# Patient Record
Sex: Female | Born: 2005 | Race: Black or African American | Hispanic: No | Marital: Single | Smoking: Never smoker
Health system: Southern US, Community
[De-identification: ages and names within clinical notes are randomized; demographics above are authoritative.]

## PROBLEM LIST (undated history)

## (undated) DIAGNOSIS — H6120 Impacted cerumen, unspecified ear: Secondary | ICD-10-CM

## (undated) HISTORY — PX: FRENULECTOMY, LINGUAL: SHX1681

---
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
## 2024-01-08 ENCOUNTER — Ambulatory Visit: Admitting: Pediatrics

## 2024-01-10 ENCOUNTER — Ambulatory Visit: Admitting: Pediatrics

## 2024-01-10 ENCOUNTER — Encounter: Payer: Self-pay | Admitting: Pediatrics

## 2024-01-10 ENCOUNTER — Other Ambulatory Visit (HOSPITAL_COMMUNITY)
Admission: RE | Admit: 2024-01-10 | Discharge: 2024-01-10 | Disposition: A | Discharge status: Home / Self Care | Source: Ambulatory Visit | Attending: Pediatrics | Admitting: Pediatrics

## 2024-01-10 VITALS — BP 118/81 | HR 101 | Ht 65.67 in | Wt 213.0 lb

## 2024-01-10 DIAGNOSIS — Z113 Encounter for screening for infections with a predominantly sexual mode of transmission: Secondary | ICD-10-CM | POA: Diagnosis not present

## 2024-01-10 DIAGNOSIS — Z3202 Encounter for pregnancy test, result negative: Secondary | ICD-10-CM

## 2024-01-10 DIAGNOSIS — J309 Allergic rhinitis, unspecified: Secondary | ICD-10-CM

## 2024-01-10 DIAGNOSIS — N926 Irregular menstruation, unspecified: Secondary | ICD-10-CM | POA: Diagnosis not present

## 2024-01-10 LAB — POCT URINE PREGNANCY: Preg Test, Ur: NEGATIVE

## 2024-01-10 MED ORDER — FLUTICASONE PROPIONATE 50 MCG/ACT NA SUSP: 1.0000 | Freq: Every day | NASAL | 5 refills | Status: AC

## 2024-01-10 MED ORDER — FEXOFENADINE HCL 180 MG PO TABS: 180.0000 mg | ORAL_TABLET | Freq: Every day | ORAL | 6 refills | Status: AC

## 2024-01-10 MED ORDER — NORETHIN ACE-ETH ESTRAD-FE 1-20 MG-MCG PO TABS: 1.0000 | ORAL_TABLET | Freq: Every day | ORAL | 11 refills | Status: DC

## 2024-01-10 NOTE — Progress Notes (Unsigned)
 Subjective:    Suzanne Acevedo is a 18 y.o. female  presenting to the clinic today for folow up on breakthrough bleeding on Nexplanon . She was last seen in clinic 2 months back for increased breakthrough bleeding on the Nexplanon  & also some adjustment issues. Nexplanon  placed 2 years ago- 09/14/21. She was started on norethindrone-ethinyl estradiol  for breakthrough bleeding at her last visit & also started sessions with our [facility]. Pt reports that she continues to having prolonged irregular bleeding but has not been taking the OCP. She only took the pills briefly & forgot to take the pill pack. Patient is interested in continuing some form of birth control but is unsure if she wants the Nexplanon .  She would like further discussion regarding birth control options and wants to explore the option of IUD. She recently broke up with her boyfriend but had been sexually active for the last month.   Review of Systems  Constitutional:  Negative for activity change, appetite change, fatigue and fever.  HENT:  Negative for congestion.   Respiratory:  Negative for cough, shortness of breath and wheezing.   Gastrointestinal:  Negative for abdominal pain, diarrhea, nausea and vomiting.  Genitourinary:  Positive for vaginal bleeding. Negative for dysuria.  Skin:  Negative for rash.  Neurological:  Negative for headaches.  Psychiatric/Behavioral:  Negative for sleep disturbance.        Objective:   Physical Exam Vitals and nursing note reviewed.  Constitutional:      General: She is not in acute distress. HENT:     Head: Normocephalic and atraumatic.     Right Ear: External ear normal.     Left Ear: External ear normal.     Nose: Nose normal.  Eyes:     General:        Right eye: No discharge.        Left eye: No discharge.     Conjunctiva/sclera: Conjunctivae normal.  Cardiovascular:     Rate and Rhythm: Normal rate and regular rhythm.     Heart sounds: Normal heart sounds.  Pulmonary:      Effort: No respiratory distress.     Breath sounds: No wheezing or rales.  Musculoskeletal:     Cervical back: Normal range of motion.  Skin:    General: Skin is warm and dry.     Findings: No rash.    .BP 118/81 (BP Location: Left Arm, Patient Position: Sitting, Cuff Size: Normal)   Pulse (!) 101   Ht 5' 5.67 (1.668 m)   Wt 213 lb (96.6 kg)   BMI 34.73 kg/m         Assessment & Plan:  1. Irregular menstrual bleeding (Primary) Advised patient to use OCP if she restarts with the regular bleeding.  Presently her cycle has ended and she does not have any spotting. - norethindrone-ethinyl estradiol -FE (JUNEL FE 1/20) 1-20 MG-MCG tablet; Take 1 tablet by mouth daily.  Dispense: 28 tablet; Refill: 11  Also discussed various options for OCP including IUD.  She would like to schedule an appointment to discuss IUD and removal of Nexplanon .  Appointment scheduled with red pod 2. Screening examination for venereal disease  - Urine cytology ancillary only   3. Allergic rhinitis, unspecified seasonality, unspecified trigger Refilled meds - fluticasone  (FLONASE ) 50 MCG/ACT nasal spray; Place 1 spray into both nostrils daily.  Dispense: 16 g; Refill: 5   4.  Adjustment issues Patient expressed sadness regarding break-up with her boyfriend as well as lack  of clarity regarding her career in future.  Encouraged her to continue appointments with [facility] and to also look into [employer] options and reapplying for school spring 2026  Time spent reviewing chart in preparation for visit:  5 minutes Time spent face-to-face with patient: 20 minutes Time spent not face-to-face with patient for documentation and care coordination on date of service: 5 minutes  Return in about 4 weeks (around 02/07/2024) for red pod- Bari Molt.  Arthor Harris, MD 01/12/2024 6:22 PM

## 2024-01-11 ENCOUNTER — Ambulatory Visit

## 2024-01-11 DIAGNOSIS — F411 Generalized anxiety disorder: Secondary | ICD-10-CM | POA: Diagnosis not present

## 2024-01-11 LAB — URINE CYTOLOGY ANCILLARY ONLY
Chlamydia: NEGATIVE
Comment: NEGATIVE
Comment: NORMAL
Neisseria Gonorrhea: NEGATIVE

## 2024-01-11 NOTE — BH Specialist Note (Unsigned)
 Integrated Behavioral Health Follow Up In-Person Visit  MRN: [id number] Name: Suzanne Acevedo  Number of Integrated Behavioral Health Clinician visits: 5-Fifth Visit  Session Start time: 1330   Session End time: 1434  Total time in minutes: 64    Types of Service: {CHL AMB TYPE OF SERVICE:[phone]}  Interpretor:{yes wn:[id number]} Interpretor Name and Language: ***  Subjective: Suzanne Acevedo is a 18 y.o. female accompanied by {Patient accompanied by:[phone]} Patient was referred by *** for ***. Patient reports the following symptoms/concerns: *** Duration of problem: ***; Severity of problem: {Mild/Moderate/Severe:20260}  Objective: Mood: {BHH MOOD:22306} and Affect: {BHH AFFECT:22307} Risk of harm to self or others: {CHL AMB BH Suicide Current Mental Status:21022748}  Life Context: Family and Social: *** School/Work: *** Self-Care: *** Life Changes: ***  Patient and/or Family's Strengths/Protective Factors: {CHL AMB BH PROTECTIVE FACTORS:[phone]}  Goals Addressed: Patient will:  Reduce symptoms of: {IBH Symptoms:21014056}   Increase knowledge and/or ability of: {IBH Patient Tools:21014057}   Demonstrate ability to: {IBH Goals:21014053}  Progress towards Goals: {CHL AMB BH PROGRESS TOWARDS GOALS:[phone]}  Interventions: Interventions utilized:  {IBH Interventions:21014054} Standardized Assessments completed: {IBH Screening Tools:21014051}      Patient and/or Family Response: ***  Patient Centered Plan: Patient is on the following Treatment Plan(s): ***  Clinical Assessment/Diagnosis  No diagnosis found.    Assessment: Patient currently experiencing ***.   Patient may benefit from ***.  Plan: Follow up with behavioral health clinician on : *** Behavioral recommendations: *** Referral(s): {IBH Referrals:21014055}  [employer], LCSW

## 2024-01-11 NOTE — BH Specialist Note (Deleted)
 ADULT Comprehensive Clinical Assessment (CCA) Note   01/11/2024 Suzanne Acevedo [id number]   Referring Provider: Dr. Gabriella Session Start time: 1559    Session End time: 1650  Total time in minutes: 51    SUBJECTIVE: Suzanne Acevedo is a 18 y.o.   female accompanied by Self  [name] was seen in consultation at the request of [name], MD for evaluation of {CHL AMB PED BEHAVIORAL LEARNING PROBLEMS:210130101}.  Types of Service: Comprehensive Clinical Assessment (CCA)  Reason for referral in patient/family's own words:  ***    She likes to be called Suzanne Acevedo.  She came to the appointment with {CHL AMB ACCOMPANIED AB:[id number]}.  Primary language at home is {CHL AMB BASIC LANGUAGE SPOKEN:[phone]}  Constitutional Appearance: {CHL AMB PED CONSTITUTIONAL:210130113}, well-nourished, well-developed, alert and well-appearing  (Patient to answer as appropriate) Gender identity: *** Sex assigned at birth: *** Pronouns: {he/she/they:23295}   Mental status exam:   General Appearance /Behavior:  {BHH GENERALAPPEARANCE/BEHAVIOR:22300} Eye Contact:  {BHH EYE CONTACT:22301} Motor Behavior:  {BHH MOTOR BEHAVIOR:22302} Speech:  {BHH SPEECH:22304} Level of Consciousness:  {BHH LEVEL OF CONSCIOUSNESS:22305} Mood:  {BHH MOOD:22306} Affect:  {BHH AFFECT:22307} Anxiety Level:  {BHH ANXIETY LEVEL:22308} Thought Process:  {BHH THOUGHT PROCESS:22309} Thought Content:  {BHH THOUGHT CONTENT:22310} Perception:  {BHH PERCEPTION:22311} Judgment:  {BHH JUDGMENT:22312} Insight:  {BHH INSIGHT:22313}   Current Medications and therapies: She is taking:  {CHL AMB TAKING MEDICATIONS:220130102}   Therapies:  {CHL AMB [phone]}  Family history: Family mental illness:  {CHL AMB FAMILY MENTAL ILLNESS:[phone]} Family school achievement history:  {CHL AMB FAMILY SCHOOL ACHIEVEMENT HISTORY:[phone]} Other relevant family history:  {CHL AMB OTHER RELEVANT FAMILY  HISTORY:210130114}  Social History: Now living with {CHL AMB LIVING TPUY:7898698971}. {CHL AMB PED PARENT/GUARDIAN RELATIONS:210130115}. Employment:  {CHL AMB PARENT/GUARDIAN [phone]} Main caregiver's health:  {CHL AMB CAREGIVER HEALTH:[phone]} Religious or Spiritual Beliefs: ***  Mood: She {CHL AMB PARENTS MOOD CONCERNS:[phone]}. {CHL AMB [phone]}  Negative Mood Concerns {CHL AMB NEGATIVE THOUGHTS:210130169}. Self-injury:  {CHL AMB DID NOT JDX:789869825} Suicidal ideation:  {CHL AMB DID NOT JDX:789869825} Suicide attempt:  {CHL AMB DID NOT JDX:789869825}  Additional Anxiety Concerns: Panic attacks:  {CHL AMB YES/NO/NOT APPLICABLE:210130111} Obsessions:  {CHL AMB YES/NO/NOT APPLICABLE:210130111} Compulsions:  {CHL AMB YES/NO/NOT APPLICABLE:210130111}  Stressors:  {CHL AMB BH STRESSORS:6185412307}  Alcohol and/or Substance Use: Have you recently consumed alcohol? {YES/NO/WILD RJMID:81418}  Have you recently used any drugs?  {YES/NO/WILD RJMID:81418}  Have you recently consumed any tobacco? {YES/NO/WILD CARDS:18581} Does patient seem concerned about dependence or abuse of any substance? {YES/NO/WILD RJMID:81418}  Substance Use Disorder Checklist:  {CHL AMB BH CHECKLIST FOR SUBSTANCE USE DISORDER:[phone]}  Severity Risk Scoring based on DSM-5 Criteria for Substance Use Disorder. The presence of at least two (2) criteria in the last 12 months indicate a substance use disorder. The severity of the substance use disorder is defined as:  Mild: Presence of 2-3 criteria Moderate: Presence of 4-5 criteria Severe: Presence of 6 or more criteria  Traumatic Experiences: History or current traumatic events (natural disaster, house fire, etc.)? {YES/NO/WILD RJMID:81418} History or current physical trauma?  {YES/NO/WILD RJMID:81418} History or current emotional trauma?  {YES/NO/WILD RJMID:81418} History or current sexual trauma?  {YES/NO/WILD  RJMID:81418} History or current domestic or intimate partner violence?  {YES/NO/WILD RJMID:81418} History of bullying:  {YES/NO/WILD CARDS:18581}  Risk Assessment: Suicidal or homicidal thoughts?   {YES/NO/WILD RJMID:79211} Self injurious behaviors?  {YES/NO/WILD RJMID:79211} Guns in the home?  {YES/NO/WILD RJMID:79211}  Self Harm Risk Factors: {CHL AMB BH SELF HARM RISK FACTORS:[phone]}  Self  Harm Thoughts?: {CHL AMB BH SELF HARM THOUGHTS:[phone]}  Patient and/or Family's Strengths/Protective Factors: {CHL AMB BH PROTECTIVE FACTORS:[phone]}  Patient's and/or Family's Goals in their own words: ***  Interventions: Interventions utilized:  {IBH Interventions:21014054:::0}   Patient and/or Family Response: ***  Standardized Assessments completed: {IBH Screening Tools:21014051:::0}  Patient Centered Plan: Patient is on the following Treatment Plan(s):  ***  Clinical Assessment/Diagnosis  No diagnosis found.      Assessment: Patient currently experiencing ***.   Patient may benefit from ***.  Coordination of Care: {CHL AMB BH COORDINATION OF CARE:5313798032}  DSM-5 Diagnosis: ***  Recommendations for Services/Supports/Treatments: ***  Progress towards Goals: {CHL AMB BH PROGRESS TOWARDS HNJOD:7896499943}  Treatment Plan Summary: Behavioral Health Clinician will: {CHL AMB BH TREATMENT PLAN SUMMARY THERAPIST TPOO:7896499945}  Individual will: {CHL AMB BH TREATMENT PLAN SUMMARY INDIVDUAL TPOO:7896499946}  Referral(s): {IBH Referrals:21014055}  [employer], LCSW

## 2024-01-17 ENCOUNTER — Ambulatory Visit

## 2024-01-17 DIAGNOSIS — F411 Generalized anxiety disorder: Secondary | ICD-10-CM | POA: Diagnosis not present
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
## 2024-01-24 ENCOUNTER — Ambulatory Visit (INDEPENDENT_AMBULATORY_CARE_PROVIDER_SITE_OTHER)

## 2024-01-24 DIAGNOSIS — F411 Generalized anxiety disorder: Secondary | ICD-10-CM | POA: Diagnosis not present

## 2024-01-24 NOTE — Patient Instructions (Signed)
  Practice one of this skills each morning for the next week:  1. Deep Breathing Breathwork is a classic mindfulness activity, which typically refers to purposefully manipulating the breath while mindfully focusing on it. This particular activity refers to deep, slow breaths (sometimes called belly breaths) that use the diaphragm. This encourages your body to relax.  2. Paced Breathing (e.g., Inhale 5, Exhale 7) An additional type of breathwork is paced breathing, where the length of the inhales and exhales are purposefully manipulated. It can be helpful to have a longer exhale than inhale, because our heart rate slightly slows during the exhale. Try inhaling to a count of 5, and exhaling to a count of 7. Use the skills from the previous activities (deep breathing) so your breaths are diaphragmatic.  3. Progressive Muscle Relaxation Progressive muscle relaxation refers to tensing and releasing specific muscle groups. For example, scrunch your shoulders up to your ears; bring as much tension as possible to your neck and shoulders. Slowly count to three and release all that tension. Now continue that with all muscle groups, including your hands, arms, chest, and stomach. You can do larger or smaller muscle groups depending on your preferences.  4. Meditation There are many types of meditation, which typically involve keeping one physical position and paying attention to one thing, like your breathing, a mantra, or physical sensations. When your mind wanders--and it will--nonjudgmentally bring it back to that point of attention. Meditating can be uncomfortable at first, and may not be appropriate for ages. If it feels too uncomfortable, it's OK to use other mindfulness activities.  5. Grounding With 5-4-3-2-1 The 5-4-3-2-1 exercise brings teens, or people of any age, back to the present moment through all of their senses.  Notice and say out loud or internally:  5 things you can see (pick a color,  for example, 5 blue things) 4 sensations you can feel (e.g., your back against the chair, cool air on your hands) 3 sounds you can hear 2 things you can smell (it's OK to actively smell things, like the laundry detergent on your clothes) 1 thing you can taste

## 2024-01-24 NOTE — BH Specialist Note (Signed)
 Integrated Behavioral Health Follow Up In-Person Visit  MRN: [id number] Name: Suzanne Acevedo  Number of Integrated Behavioral Health Clinician visits: Additional Visit  Session Start time: 9062   Session End time: 1010  Total time in minutes: 33    Types of Service: Individual psychotherapy  Interpretor:No. Interpretor Name and Language: n/a   Subjective: Suzanne Acevedo is a 18 y.o. female accompanied by self Suzanne Acevedo was referred by Dr. Gabriella for mood. Suzanne Acevedo reports the following symptoms/concerns: Suzanne Acevedo shared that she has been feeling better today. She reported that her anxiety was elevated yesterday and she felt that her heart was dropping throughout the day. She expressed that it feels like an anxiety attack because she also has shortness of breath.  Initially she couldn't identify the trigger, but after support from [facility] she realized she was thinking about he ex boyfriend.   Suzanne Acevedo has been journaling daily and completed assignments from previous visit. She shared traits and behaviors that her previous relationship brought out in her including anxiety, impulsiveness and anger. These are areas she would like to improve.   Suzanne Acevedo expressed interest in medication management consultation with Dr. Gabriella. Duration of problem: years; Severity of problem: moderate  Objective: Mood: Euthymic and Affect: Appropriate Risk of harm to self or others: No plan to harm self or others    Patient and/or Family's Strengths/Protective Factors: Concrete supports in place (healthy food, safe environments, etc.) and Physical Health (exercise, healthy diet, medication compliance, etc.)  Goals Addressed: Patient will:  Suzanne Acevedo will identify and implement at least 3 coping strategies (e.g. grounding techniques, deep breathing, journaling) to manage symptoms of anxiety, reporting a reductions in anxiety from daily levels of 7/10 to 5/10 or less on self rating scale within 12 weeks.  Suzanne Acevedo will  practice and demonstrate at least 2 emotional regulation strategies (e.g., mindfulness, stop and think pause, self-soothing) during session and in real life situations, resulting in a decrease in impulsive reactions and improved communication in at least 2 identified interpersonal relationships within 16 weeks.  Progress towards Goals: Revised and Ongoing  Interventions: Interventions utilized:  CBT Cognitive Behavioral Therapy [facility] utilized CBT to explore thoughts surrounding the incident with her heart dropping. Discussed challenging negative thinking to improve overall mood. Provided space for her to share her response to the prompt and engaged her in reflection for ways to improve habits and behaviors. Educated her on grounding strategies to use when she feels anxious including mindful breathing.  Standardized Assessments completed: Not Needed    Patient and/or Family Response: Suzanne Acevedo was engaged and attentive during the visit. She was open to discussing current stressors and recent incident with heart dropping. Suzanne Acevedo was able to demonstrate thought challenging negative thoughts during the visit. She displayed reflective thinking when discussing her desire to contact her ex noting that it may be a barrier to her progress.   Patient Centered Plan: Patient is on the following Treatment Plan(s): Interpersonal Stress  Clinical Assessment/Diagnosis  Generalized anxiety disorder    Assessment: Suzanne Acevedo currently experiencing increased anxiety and stress following recent break-up with boyfriend. Suzanne Acevedo is demonstrating ability to thoughtfully reflect during the visit. Has demonstrated impulse control by not reaching out to her ex boyfriend despite wanting to.    Suzanne Acevedo may benefit from continuing therapy  to learn ways to activate positive behaviors and coping strategies to manage anxiety and impulsiveness. .  Plan: Follow up with behavioral health clinician on : 02/01/2024 Behavioral  recommendations:  Review information shared about mindful strategies.  Referral(s):  Integrated [facility]al [facility] Services (In Clinic)   Individual Treatment Plan  Strengths:  Suzanne Acevedo is motivated to improve functioning and overall well-being.  2. Insightful and self aware about challenges   Supports: Suzanne Acevedo has support from her family.    Goal/Needs for Treatment:  In order of importance to patient  1) Suzanne Acevedo will identify and implement at least 3 coping strategies (e.g. grounding techniques, deep breathing, journaling) to manage symptoms of anxiety, reporting a reductions in anxiety from daily levels of 7/10 to 5/10 or less on self rating scale within 12 weeks.   2) Suzanne Acevedo will practice and demonstrate at least 2 emotional regulation strategies (e.g., mindfulness, stop and think pause, self-soothing) during session and in real life situations, resulting in a decrease in impulsive reactions and improved communication in at least 2 identified interpersonal relationships within 16 weeks.    Client Statement of Needs: I need to work on managing my emotions and decrease my anxiety.   Treatment Level Weekly   Symptoms: Suzanne Acevedo has been experiencing increased levels of anxiety and difficulty with interpersonal relationships for the past several years.  Struggles with emotional regulation and impulsivity.   Client Treatment Preferences: Prefers weekly in person visits. Visits for 1 hour. Open ended discussions/talk therapy with practical applications of skills to current stressors.    Healthcare consumer's goal for treatment: Reduce symptoms of anxiety and emotional dysregulation   Healthcare consumer will: Actively participate in therapy, working towards healthy functioning.   Behavioral Health Clinician will: Provide individual therapy utilizing CBT strategies to help Suzanne Acevedo identify and challenge anxious thought patterns, build coping skills, and reduce overall anxiety symptoms.     *Justification for Continuation/Discontinuation of Goal: R=Revised, O=Ongoing, A=Achieved, D=Discontinued  Goal 1)  Likert rating baseline date 01/24/2024: How Easy -      ; How Often -      % Target Date Goal Was reviewed Status Code Progress towards goal/Likert rating  04/20/2024                Goal 2)  Likert rating baseline date 9/24/20254: How Easy -     ; How Often -      % Target Date Goal Was reviewed Status Code Progress towards goal/Likert rating  05/21/2024                   This plan will be reviewed at least every 12 months. This plan has been reviewed and created by the following participants:   Date Behavioral Health Clinician Date Guardian/Patient                          Silvano JINNY [place], [place]

## 2024-02-01 ENCOUNTER — Ambulatory Visit

## 2024-02-01 DIAGNOSIS — F411 Generalized anxiety disorder: Secondary | ICD-10-CM | POA: Diagnosis not present

## 2024-02-01 NOTE — BH Specialist Note (Unsigned)
 Integrated Behavioral Health via Telemedicine Visit  02/02/2024 Suzanne Acevedo [id number]  Number of Integrated Behavioral Health Clinician visits: Additional Visit  Session Start time: 1330   Session End time: 1431  Total time in minutes: 61    Referring Provider: Dr. Gabriella Patient/Family location: Home  [facility] Provider location: [facility] Office  All persons participating in visit: [facility] and patient Types of Service: Individual psychotherapy and Video visit  I connected with Mahati Zweber  via  Telephone or Video Enabled Telemedicine Application  (Video is Caregility application) and verified that I am speaking with the correct person using two identifiers. Discussed confidentiality: Yes   I discussed the limitations of telemedicine and the availability of in person appointments.  Discussed there is a possibility of technology failure and discussed alternative modes of communication if that failure occurs.  I discussed that engaging in this telemedicine visit, they consent to the provision of behavioral healthcare and the services will be billed under their insurance.  Patient and/or legal guardian expressed understanding and consented to Telemedicine visit: Yes   Presenting Concerns: Briannon and/or family reports the following symptoms/concerns:   -continued anxiety regarding recent break up -negative thought patterns about self.  - still experiencing heart drop when she feels anxious.   Duration of problem: months to years; Severity of problem: mild  Patient and/or Family's Strengths/Protective Factors: Concrete supports in place (healthy food, safe environments, etc.) and Physical Health (exercise, healthy diet, medication compliance, etc.)  Goals Addressed: Leronda will:  Kaydon will identify and implement at least 3 coping strategies (e.g. grounding techniques, deep breathing, journaling) to manage symptoms of anxiety, reporting a reductions in anxiety from daily levels of  7/10 to 5/10 or less on self rating scale within 12 weeks.  Mikenzie will practice and demonstrate at least 2 emotional regulation strategies (e.g., mindfulness, stop and think pause, self-soothing) during session and in real life situations, resulting in a decrease in impulsive reactions and improved communication in at least 2 identified interpersonal relationships within 16 weeks. Progress towards Goals: Ongoing    Interventions: Interventions utilized:  Motivational Interviewing, CBT Cognitive Behavioral Therapy, and Supportive Counseling [facility] engaged Lenyx in reflection of how current behaviors, thoughts, and actions impact (positively or negatively) her personal goals. Reviewed the cognitive model and encouraged Kieley to challenge negative thoughts as they arise. [facility] challenged Trinita's thoughts about closure and supported her in coming to terms with her inability to control the future.   Standardized Assessments completed: Not Needed    Patient and/or Family Response: Karesha was engaged and attentive during the visit. She updated [facility] on her recent cabin trip with family. She reported that she enjoyed herself, but that there were a lot of feelings. Anshi started to think about her ex boyfriend, he should be here, which she reported made her feel down. One of her cousins brought her new boyfriend and Delayza shared that it made her feel hopeful for her future. She shared her cousin was also in an unhealthy relationship and is with someone better now.   Kathryn continued expressing frustration about still missing and thinking about her ex. She stated I want to heal the right way. Tyjae disclosed that she still notices negative thought patterns when she thinks about him meeting someone else and treating them better than her. These thoughts increase her feelings of anxiety. She stated I just want to talk to him to have closure. I need to know if we are really done forever. [facility] challenged  Laurelyn's thoughts about  closure and supported her in coming to terms with her inability to control the future.   George engaged with [facility] to challenge negative thoughts in real time. Ximenna identified personal goals she has for herself and how her what she does not impacts those goals. Madysin reviewed her previous journal assignment with [facility] answering what her life would be like if she and her ex did not get back together. She identified several positives for how her life would be. Processing with [facility] seemed to help shift Jennette's mindset during the visit.   Clinical Assessment/Diagnosis  Generalized anxiety disorder    Assessment: Semya currently experiencing on-going anxiety and stress due to break-up. It appears that interpersonal dependence is greatly impacting her mood and behaviors. Jailee's need for closure from her ex demonstrates her dependence on the relationship to determine how she moves forwards, if he tells me its really done then I can move one. While Jonnae expressed understanding of personal areas of improvement, it appears that her desire for companionship may over power desire to address her own underlying issues.    Arshia may benefit from from continuing therapy to learn ways to activate positive behaviors and coping strategies to manage anxiety and impulsiveness. .  Plan: Follow up with behavioral health clinician on : 02/14/2024 Behavioral recommendations:  Continue journaling to help with self-reflection  Referral(s): Integrated [employer] (In Clinic)  I discussed the assessment and treatment plan with the patient and/or parent/guardian. They were provided an opportunity to ask questions and all were answered. They agreed with the plan and demonstrated an understanding of the instructions.   They were advised to call back or seek an in-person evaluation if the symptoms worsen or if the condition fails to improve as anticipated.  Silvano PARAS [place],  LCSW

## 2024-02-12 ENCOUNTER — Other Ambulatory Visit (HOSPITAL_COMMUNITY)
Admission: RE | Admit: 2024-02-12 | Discharge: 2024-02-12 | Disposition: A | Discharge status: Home / Self Care | Source: Ambulatory Visit | Attending: Family | Admitting: Family

## 2024-02-12 ENCOUNTER — Ambulatory Visit: Admitting: Family

## 2024-02-12 ENCOUNTER — Encounter: Payer: Self-pay | Admitting: Family

## 2024-02-12 VITALS — BP 126/76 | HR 69 | Ht 66.0 in | Wt 205.8 lb

## 2024-02-12 DIAGNOSIS — F411 Generalized anxiety disorder: Secondary | ICD-10-CM

## 2024-02-12 DIAGNOSIS — Z113 Encounter for screening for infections with a predominantly sexual mode of transmission: Secondary | ICD-10-CM | POA: Insufficient documentation

## 2024-02-12 DIAGNOSIS — Z68.41 Body mass index (BMI) pediatric, 85th percentile to less than 95th percentile for age: Secondary | ICD-10-CM

## 2024-02-12 DIAGNOSIS — E559 Vitamin D deficiency, unspecified: Secondary | ICD-10-CM | POA: Diagnosis not present

## 2024-02-12 DIAGNOSIS — N926 Irregular menstruation, unspecified: Secondary | ICD-10-CM | POA: Diagnosis not present

## 2024-02-12 DIAGNOSIS — Z8349 Family history of other endocrine, nutritional and metabolic diseases: Secondary | ICD-10-CM

## 2024-02-12 DIAGNOSIS — Z3202 Encounter for pregnancy test, result negative: Secondary | ICD-10-CM | POA: Diagnosis not present

## 2024-02-12 DIAGNOSIS — Z3046 Encounter for surveillance of implantable subdermal contraceptive: Secondary | ICD-10-CM | POA: Diagnosis not present

## 2024-02-12 DIAGNOSIS — N925 Other specified irregular menstruation: Secondary | ICD-10-CM

## 2024-02-12 DIAGNOSIS — Z975 Presence of (intrauterine) contraceptive device: Secondary | ICD-10-CM | POA: Diagnosis not present

## 2024-02-12 DIAGNOSIS — N921 Excessive and frequent menstruation with irregular cycle: Secondary | ICD-10-CM | POA: Diagnosis not present

## 2024-02-12 LAB — POCT URINE PREGNANCY: Preg Test, Ur: NEGATIVE

## 2024-02-12 MED ORDER — NORETHIN ACE-ETH ESTRAD-FE 1-20 MG-MCG PO TABS: 1.0000 | ORAL_TABLET | Freq: Every day | ORAL | 0 refills | Status: DC

## 2024-02-12 MED ORDER — HYDROXYZINE HCL 25 MG PO TABS: 25.0000 mg | ORAL_TABLET | Freq: Every evening | ORAL | 0 refills | Status: DC | PRN

## 2024-02-12 NOTE — Progress Notes (Signed)
 Acute Visit:   Suzanne Arthor GAILS, MD  Plan from last visit: Use OCPs to regulate cycle, discuss IUD, allergic Rhinitis   Pertinent Labs: -Upreg, cytology  Chart/Growth Chart Review: 115% of 95th percentile  HPI:   History was provided by the patient.  Suzanne Acevedo is a 18 y.o. female who is here for follow up regarding her mood and her irregular menstrual bleeding on her nexplanon . She reports that since she started therapy, she feels she is doing a bit better but is still having a fair amount of anxiety and difficulty regulating her mood. Her mood and anxiety symptoms have worsened since a breakup with her ex-boyfriend around a month ago. In light of this, she is wondering if her birth control could be the culprit for her mood symptoms because ever since she first began birth control as a younger teen, she felt she has had issues with her mood to some degree. They can be worse around her menses but not exclusively. She feels that she is having bouts of anger and irritability where she may say something to someone that she could regret. She is wondering if stopping/changing her birth control regimen would remedy these issues.  She also endorses continued irregular periods in which she will bleed for a month or so at a time. The first two weeks are heavier and then become lighter for the next few weeks but never fully cease. The months of bleeding will then be followed by a month of no bleeding whatsoever. She denies feeling lightheaded or dizzy and has never been anemic. Mom does have a history of heavy menstrual bleeding necessitating hysterectomy. She is wondering if stopping or changing her contraceptives will also help regulate her cycle as she says her periods were regular, and lasted 7 days prior to starting her birth control. She was prescribed a course of OCPs but did not end up taking the full course.  She does also endorse a history of vitamin D  deficiency but has not been taking her  supplements regularly.    Birth Control Side Effect Management:  Compliance: Has Nexplanon  Bleeding Pattern: Irregular, on for month to months LMP: Uncertain Cramping: not reported  Mood concerns/changes: yes Any other side effects: none reported Sexually active: Not currently  Review of Systems  Constitutional: Negative.   Cardiovascular:  Negative for leg swelling.  Gastrointestinal: Negative.   Genitourinary: Negative.   Musculoskeletal: Negative.   Neurological:  Negative for dizziness and weakness.  Psychiatric/Behavioral:  The patient is nervous/anxious and has insomnia.      Patient Active Problem List   Diagnosis Date Noted   Shoulder pain 03/04/2021   Oral allergy  syndrome, initial encounter 07/11/2017   Overweight, pediatric, BMI 85.0-94.9 percentile for age 40/14/2017   BMI (body mass index), pediatric, 85% to less than 95% for age 42/13/2016   Other acne 08/14/2014   Allergic rhinitis 08/12/2014    Current Outpatient Medications on File Prior to Visit  Medication Sig Dispense Refill   Etonogestrel  (NEXPLANON  [place]) Inject into the skin.     fexofenadine  (ALLEGRA  ALLERGY ) 180 MG tablet Take 1 tablet (180 mg total) by mouth daily. 30 tablet 6   fluticasone  (FLONASE ) 50 MCG/ACT nasal spray Place 1 spray into both nostrils daily. 16 g 5   ibuprofen  (ADVIL ) 800 MG tablet Take 1 tablet (800 mg total) by mouth 3 (three) times daily. (Patient not taking: Reported on 11/06/2023) 21 tablet 0   norethindrone-ethinyl estradiol -FE (JUNEL FE 1/20) 1-20 MG-MCG tablet Take 1  tablet by mouth daily. 28 tablet 11   No current facility-administered medications on file prior to visit.    Allergies  Allergen Reactions   Milk-Related Compounds Rash    Physical Exam:    Vitals:   02/12/24 0952 02/12/24 0955  BP: 133/85 126/76  Pulse: 83 69  Weight:  205 lb 12.8 oz (93.4 kg)  Height:  5' 6 (1.676 m)    Blood pressure %iles are not available for patients who are 18 years or  older. No LMP recorded. Patient has had an implant.  Physical Exam Constitutional:      Appearance: Normal appearance. She is obese. She is not ill-appearing.  HENT:     Head: Normocephalic and atraumatic.  Cardiovascular:     Rate and Rhythm: Normal rate and regular rhythm.     Pulses: Normal pulses.     Heart sounds: Normal heart sounds.  Pulmonary:     Effort: Pulmonary effort is normal.  Abdominal:     General: There is no distension.  Musculoskeletal:        General: Normal range of motion.     Cervical back: Normal range of motion.     Right lower leg: No edema.     Left lower leg: No edema.  Skin:    General: Skin is warm and dry.  Neurological:     General: No focal deficit present.     Mental Status: She is alert. Mental status is at baseline.  Psychiatric:        Mood and Affect: Mood normal.        Behavior: Behavior normal.        Thought Content: Thought content normal.        Judgment: Judgment normal.     Assessment/Plan: 1. Routine screening for STI (sexually transmitted infection) (Primary) - Urine cytology ancillary only  2. Pregnancy examination or test, negative result - POCT urine pregnancy  3. Generalized anxiety disorder We had a long discussion today about her overal mental health in light of her menstrual changes and birth control concerns. It sounds like her symptoms are more consistent with generalized anxiety irrespective of her menstrual symptoms. We discussed SSRI vs trial of Atarax  at bedtime as she is also having some difficulty sleeping because of anxious thoughts keeping her up. She even endorses anxiety when she is doing good things for herself like going for a walk or reading her bible. Ultimately, we decided to start the Atarax  25-50mg  at bedtime to see if this improves her sleep and in turn her overall mood. We will evaluate her in 2 weeks to see if this has been effective for her at which time we can also consider SSRI initiation. We  also counseled her on the importance of exercise, positive thoughts, and getting some sunlight each day to improve mood and for her to also help with her vitamin D  deficiency.  4. Breakthrough bleeding on Nexplanon  We discussed IUD vs removal of birth control implant and ultimately decided we will try to use the OCPs to regulate her cycle. Given her reported bleeding length during menses, we also want to make sure she is not anemic especially given family history in mom of AUB with hysterectomy. She did not take the full pill pack (only 4 days) last time so we discussed importance of taking all pills save the placebo week. - CBC with Differential/Platelet - Hemoglobin A1c - norethindrone-ethinyl estradiol -FE (JUNEL FE 1/20) 1-20 MG-MCG tablet; Take 1 tablet by mouth  daily.  Dispense: 28 tablet; Refill: 0  5. Family history of thyroid disease Given mood symptoms with weight gain noted on growth chart, it is reasonable to perform TFTs to rule out an underlying medical etiology for her mood symptoms. - CBC with Differential/Platelet - Comprehensive metabolic panel with GFR - Hemoglobin A1c - Thyroid Panel With TSH  6. BMI (body mass index), pediatric, 85% to less than 95% for age Discussed importance of good exercise and how it can help with mood regulation as well as how it is beneficial for overall health and wellbeing. We will also check an A1c and lipids because mom had mentioned a first-degree relative who has PCOS which could also be contributing to her menstrual irregularities. Conversely, it could also be her additional weight gain alone that has exacerbated this bleeding irregularity. - CBC with Differential/Platelet - Hemoglobin A1c - Lipid panel  7. Vitamin D  deficiency We will test for her current vitamin D  levels, if deficient, will consider supplementation. She had been prescribed these in the past but has not taken them for some time. - VITAMIN D  25 Hydroxy (Vit-D Deficiency,  Fractures)

## 2024-02-12 NOTE — Patient Instructions (Signed)
 Today we discussed a few things:   1) we will review the results from the blood work at your upcoming appointment 2) take hydroxyzine  25-50 mg as needed at bedtime to help with sleep.  3) we will start the ADHD pathway   4) take birth control pills (only the first 3 rows) when you are bleeding.

## 2024-02-13 LAB — COMPREHENSIVE METABOLIC PANEL WITH GFR
AG Ratio: 1.6 (calc) (ref 1.0–2.5)
ALT: 9 U/L (ref 5–32)
AST: 17 U/L (ref 12–32)
Albumin: 4.5 g/dL (ref 3.6–5.1)
Alkaline phosphatase (APISO): 47 U/L (ref 36–128)
BUN: 9 mg/dL (ref 7–20)
CO2: 27 mmol/L (ref 20–32)
Calcium: 9.6 mg/dL (ref 8.9–10.4)
Chloride: 105 mmol/L (ref 98–110)
Creat: 0.83 mg/dL (ref 0.50–0.96)
Globulin: 2.8 g/dL (ref 2.0–3.8)
Glucose, Bld: 104 mg/dL (ref 65–139)
Potassium: 3.9 mmol/L (ref 3.8–5.1)
Sodium: 139 mmol/L (ref 135–146)
Total Bilirubin: 0.6 mg/dL (ref 0.2–1.1)
Total Protein: 7.3 g/dL (ref 6.3–8.2)
eGFR: 104 mL/min/1.73m2 (ref >=60)

## 2024-02-13 LAB — CBC WITH DIFFERENTIAL/PLATELET
Absolute Lymphocytes: 1767 {cells}/uL (ref 850–3900)
Absolute Monocytes: 263 {cells}/uL (ref 200–950)
Basophils Absolute: 19 {cells}/uL (ref 0–200)
Basophils Relative: 0.4 %
Eosinophils Absolute: 19 {cells}/uL (ref 15–500)
Eosinophils Relative: 0.4 %
HCT: 33.5 % — ABNORMAL LOW (ref 35.0–45.0)
Hemoglobin: 10.4 g/dL — ABNORMAL LOW (ref 11.7–15.5)
MCH: 25.6 pg — ABNORMAL LOW (ref 27.0–33.0)
MCHC: 31 g/dL — ABNORMAL LOW (ref 32.0–36.0)
MCV: 82.3 fL (ref 80.0–100.0)
Monocytes Relative: 5.6 %
Neutro Abs: 2632 {cells}/uL (ref 1500–7800)
Neutrophils Relative %: 56 %
Platelets: 380 Thousand/uL (ref 140–400)
RBC: 4.07 Million/uL (ref 3.80–5.10)
RDW: 15.8 % — ABNORMAL HIGH (ref 11.0–15.0)
Total Lymphocyte: 37.6 %
WBC: 4.7 Thousand/uL (ref 3.8–10.8)

## 2024-02-13 LAB — THYROID PANEL WITH TSH
Free Thyroxine Index: 2.5 (ref 1.4–3.8)
T4, Total: 8.2 ug/dL (ref 5.3–11.7)
TSH: 1.92 m[IU]/L
TSH: 31 m[IU]/L (ref 22–35)

## 2024-02-13 LAB — LIPID PANEL
Cholesterol: 133 mg/dL (ref <170)
HDL: 66 mg/dL (ref >45)
LDL Cholesterol (Calc): 52 mg/dL (ref <110)
Non-HDL Cholesterol (Calc): 67 mg/dL (ref <120)
Total CHOL/HDL Ratio: 2 (calc) (ref <5.0)
Triglycerides: 70 mg/dL (ref <90)

## 2024-02-13 LAB — URINE CYTOLOGY ANCILLARY ONLY
Chlamydia: NEGATIVE
Comment: NEGATIVE
Comment: NEGATIVE
Comment: NORMAL
Neisseria Gonorrhea: NEGATIVE
Trichomonas: NEGATIVE

## 2024-02-13 LAB — VITAMIN D 25 HYDROXY (VIT D DEFICIENCY, FRACTURES): Vit D, 25-Hydroxy: 20 ng/mL — ABNORMAL LOW (ref 30–100)

## 2024-02-13 LAB — HEMOGLOBIN A1C
Hgb A1c MFr Bld: 5.6 % (ref <5.7)
Mean Plasma Glucose: 114 mg/dL
eAG (mmol/L): 6.3 mmol/L

## 2024-02-14 ENCOUNTER — Ambulatory Visit

## 2024-02-14 ENCOUNTER — Encounter: Payer: Self-pay | Admitting: Family

## 2024-02-14 DIAGNOSIS — F411 Generalized anxiety disorder: Secondary | ICD-10-CM | POA: Diagnosis not present

## 2024-02-14 NOTE — BH Specialist Note (Unsigned)
 Integrated Behavioral Health Follow Up In-Person Visit  MRN: [id number] Name: Leyanna Mcree  Number of Integrated Behavioral Health Clinician visits: Additional Visit  Session Start time: 1334   Session End time: 1431  Total time in minutes: 57    Types of Service: Individual psychotherapy  Interpretor:No. Interpretor Name and Language: n/a  Subjective: Shatima Short is a 18 y.o. female accompanied by self Lina was referred by Dr. Gabriella for concerns with mood. Zayanna reports the following symptoms/concerns:  Diamonds shared that over the past week she has been feeling the best she has in a while. She reported that she been intentionally and consistently implementing coping strategies learned in session and she feels it has helped. Aerica informed [facility] that her ex boyfriend reached out to her earlier this week and it was confusing. She acknowledged a continued desire to want the relationships but stated that she doesn't want to become distracted from her personal goals..  Alaia spent time with her sisters this past weekend for homecoming and reported that she enjoyed being out socially. Haili has also applied for two universities.   Yeira reported her anxiety this week as a 6/10, which indicates a reduction in symptoms. Nichol shared that she noticed herself feeling dysregulated during conversations with her ex boyfriend, but was able to utilize impulse control strategies (stop and think) to manage her response. She has also used these strategies with her mother.   Janeshia informed [facility] that during her recent doctors appointment she mentioned an ADHD evaluation because of concerns her mother noted. She reported that she struggles to focus for extended periods of time; complete task and remain organized. She also noted fidgeting, interrupting others and being easily distracted by things in her environment.  Duration of problem: years; Severity of problem: moderate  Objective: Mood:  Euthymic and Affect: Appropriate Risk of harm to self or others: No plan to harm self or others    Patient and/or Family's Strengths/Protective Factors: Social connections, Concrete supports in place (healthy food, safe environments, etc.), Sense of purpose, and Physical Health (exercise, healthy diet, medication compliance, etc.)  Goals Addressed: Shanequa will:  Nashaly will identify and implement at least 3 coping strategies (e.g. grounding techniques, deep breathing, journaling) to manage symptoms of anxiety, reporting a reductions in anxiety from daily levels of 7/10 to 5/10 or less on self rating scale within 12 weeks.  Cynitha will practice and demonstrate at least 2 emotional regulation strategies (e.g., mindfulness, stop and think pause, self-soothing) during session and in real life situations, resulting in a decrease in impulsive reactions and improved communication in at least 2 identified interpersonal relationships within 16 weeks.  Progress towards Goals: Ongoing  Interventions: Interventions utilized:  Motivational Interviewing, Solution-Focused Strategies, CBT Cognitive Behavioral Therapy, and Psychoeducation and/or Health Education Standardized Assessments completed: ASRS     Patient and/or Family Response: Drisana was engaged and attentive during the visit. She updated [facility] on recent communication with her ex boyfriend and processed feelings with [facility]. Chela reported a decrease in anxiety symptoms (6/10) and improvement in managing impulse with both her ex boyfriend and her mother. She endorsed desire to maintain focus on herself and personal goals. She was able to identify possible distractions and ways to manage them. She was receptive to feedback on how her thoughts impact feelings, body sensations, feelings and actions (noting her heart dropping when she feels anxious).   She was receptive to education on ADHD and completing ASRS screening. Expressed understanding of  positive indicators from screening  which could be linked to ADHD diagnosis. Holley endorsed symptoms starting as early as 10 or 10.   Patient Centered Plan: Meloni is on the following Treatment Plan(s): Interpersonal Stress  Clinical Assessment/Diagnosis  Generalized anxiety disorder    Assessment: Juliona currently experiencing improvement in anxiety symptoms as evidence by self report of 6/10. She anxiety level has not meet assigned goal which indicates a need for on-going therapy to assist with managing symptoms. She continues to demonstrate impulse control by using stop and think before responding to others. Symptoms of inattention, fidgeting, and difficulty completing task warrants further assessment for possible ADHD diagnosis.   Deyja may benefit from from continuing therapy to learn ways to activate positive behaviors and coping strategies to manage anxiety and impulsiveness. Further evaluation of symptoms on trend for ADHD diagnosis.   Plan: Follow up with behavioral health clinician on : 02/22/2024 Behavioral recommendations:  Continue journaling as a coping strategy to process thoughts and feelings Utilize stop think and act to help with impulse control.  Referral(s): Integrated [employer] (In Clinic)  [place], [place]

## 2024-02-15 ENCOUNTER — Ambulatory Visit: Payer: Self-pay | Admitting: Family

## 2024-02-15 ENCOUNTER — Other Ambulatory Visit: Payer: Self-pay | Admitting: Family

## 2024-02-15 MED ORDER — VITAMIN D (ERGOCALCIFEROL) 1.25 MG (50000 UNIT) PO CAPS: 50000.0000 [IU] | ORAL_CAPSULE | ORAL | 0 refills | Status: DC

## 2024-02-22 ENCOUNTER — Ambulatory Visit

## 2024-02-27 ENCOUNTER — Encounter: Admitting: Family

## 2024-03-06 ENCOUNTER — Encounter: Payer: Self-pay | Admitting: *Deleted

## 2024-03-14 ENCOUNTER — Ambulatory Visit (INDEPENDENT_AMBULATORY_CARE_PROVIDER_SITE_OTHER): Admitting: Family

## 2024-03-14 ENCOUNTER — Encounter: Payer: Self-pay | Admitting: Family

## 2024-03-14 ENCOUNTER — Ambulatory Visit (INDEPENDENT_AMBULATORY_CARE_PROVIDER_SITE_OTHER)

## 2024-03-14 VITALS — BP 129/80 | HR 77 | Ht 66.0 in | Wt 207.0 lb

## 2024-03-14 DIAGNOSIS — N926 Irregular menstruation, unspecified: Secondary | ICD-10-CM

## 2024-03-14 DIAGNOSIS — F411 Generalized anxiety disorder: Secondary | ICD-10-CM | POA: Diagnosis not present

## 2024-03-14 DIAGNOSIS — F4322 Adjustment disorder with anxiety: Secondary | ICD-10-CM | POA: Diagnosis not present

## 2024-03-14 DIAGNOSIS — D509 Iron deficiency anemia, unspecified: Secondary | ICD-10-CM | POA: Diagnosis not present

## 2024-03-14 DIAGNOSIS — N921 Excessive and frequent menstruation with irregular cycle: Secondary | ICD-10-CM | POA: Diagnosis not present

## 2024-03-14 DIAGNOSIS — Z975 Presence of (intrauterine) contraceptive device: Secondary | ICD-10-CM | POA: Diagnosis not present

## 2024-03-14 LAB — POCT HEMOGLOBIN: Hemoglobin: 8.8 g/dL — AB (ref 11–14.6)

## 2024-03-14 MED ORDER — JUNEL FE 1/20 1-20 MG-MCG PO TABS: 1.0000 | ORAL_TABLET | Freq: Every day | ORAL | 0 refills | Status: AC

## 2024-03-14 MED ORDER — FERROUS SULFATE 325 (65 FE) MG PO TABS: 325.0000 mg | ORAL_TABLET | Freq: Every day | ORAL | 0 refills | Status: DC

## 2024-03-14 MED ORDER — HYDROXYZINE HCL 25 MG PO TABS
25.0000 mg | ORAL_TABLET | Freq: Every evening | ORAL | 0 refills | Status: AC | PRN
Start: 2024-03-14 — End: ?

## 2024-03-14 NOTE — Patient Instructions (Signed)
 Take iron supplement once daily with breakfast.  Return in one month or sooner if needed.  Let me know if you start bleeding again.  If you do, start taking the birth control pill once daily.

## 2024-03-14 NOTE — Progress Notes (Signed)
 History was provided by the patient.  Suzanne Acevedo is a 18 y.o. female who is here for breakthrough bleeding on Nexplanon .   PCP confirmed? Yes.    [name], MD  Plan from last visit:  1. Routine screening for STI (sexually transmitted infection) (Primary) - Urine cytology ancillary only   2. Pregnancy examination or test, negative result - POCT urine pregnancy   3. Generalized anxiety disorder We had a long discussion today about her overal mental health in light of her menstrual changes and birth control concerns. It sounds like her symptoms are more consistent with generalized anxiety irrespective of her menstrual symptoms. We discussed SSRI vs trial of Atarax  at bedtime as she is also having some difficulty sleeping because of anxious thoughts keeping her up. She even endorses anxiety when she is doing good things for herself like going for a walk or reading her bible. Ultimately, we decided to start the Atarax  25-50mg  at bedtime to see if this improves her sleep and in turn her overall mood. We will evaluate her in 2 weeks to see if this has been effective for her at which time we can also consider SSRI initiation. We also counseled her on the importance of exercise, positive thoughts, and getting some sunlight each day to improve mood and for her to also help with her vitamin D  deficiency. Referral for ADHD pathway for additional work-up as strong family history of ADHD and discussed overlapping symptoms with ADHD and anxiety.    4. Breakthrough bleeding on Nexplanon  We discussed IUD vs removal of birth control implant and ultimately decided we will try to use the OCPs to regulate her cycle. Given her reported bleeding length during menses, we also want to make sure she is not anemic especially given family history in mom of AUB with hysterectomy. She did not take the full pill pack (only 4 days) last time so we discussed importance of taking all pills save the placebo week. - CBC  with Differential/Platelet - Hemoglobin A1c - norethindrone-ethinyl estradiol -FE (JUNEL FE 1/20) 1-20 MG-MCG tablet; Take 1 tablet by mouth daily.  Dispense: 28 tablet; Refill: 0   5. Family history of thyroid disease Given mood symptoms with weight gain noted on growth chart, it is reasonable to perform TFTs to rule out an underlying medical etiology for her mood symptoms. - CBC with Differential/Platelet - Comprehensive metabolic panel with GFR - Hemoglobin A1c - Thyroid Panel With TSH   6. BMI (body mass index), pediatric, 85% to less than 95% for age Discussed importance of good exercise and how it can help with mood regulation as well as how it is beneficial for overall health and wellbeing. We will also check an A1c and lipids because mom had mentioned a first-degree relative who has PCOS which could also be contributing to her menstrual irregularities. Conversely, it could also be her additional weight gain alone that has exacerbated this bleeding irregularity. - CBC with Differential/Platelet - Hemoglobin A1c - Lipid panel   7. Vitamin D  deficiency We will test for her current vitamin D  levels, if deficient, will consider supplementation. She had been prescribed these in the past but has not taken them for some time. - VITAMIN D  25 Hydroxy (Vit-D Deficiency, Fractures)    Pertinent Labs:  Lab Results  Component Value Date   HGB 8.8 (A) 03/14/2024     Chart/Growth Chart Review:  Body mass index is 33.41 kg/m.   HPI:   -stopped bleeding about the end of  last week  -wants to keep the implant; not taking birth control pills now   -using hydroxyzine  for sleep only; feels like sleeping better; taking it around 8PM and goes to bed around 10PM; sleeping all the way through the night  -has been better with being productive  -doing nails; building up clients    Patient Active Problem List   Diagnosis Date Noted   Shoulder pain 03/04/2021   Oral allergy  syndrome, initial  encounter 07/11/2017   Overweight, pediatric, BMI 85.0-94.9 percentile for age 44/14/2017   BMI (body mass index), pediatric, 85% to less than 95% for age 12/13/2014   Other acne 08/14/2014   Allergic rhinitis 08/12/2014    Current Outpatient Medications on File Prior to Visit  Medication Sig Dispense Refill   Etonogestrel  (NEXPLANON  [place]) Inject into the skin.     fexofenadine  (ALLEGRA  ALLERGY ) 180 MG tablet Take 1 tablet (180 mg total) by mouth daily. 30 tablet 6   fluticasone  (FLONASE ) 50 MCG/ACT nasal spray Place 1 spray into both nostrils daily. 16 g 5   hydrOXYzine  (ATARAX ) 25 MG tablet Take 1-2 tablets (25-50 mg total) by mouth at bedtime as needed. 30 tablet 0   norethindrone-ethinyl estradiol -FE (JUNEL FE 1/20) 1-20 MG-MCG tablet Take 1 tablet by mouth daily. 28 tablet 0   Vitamin D , Ergocalciferol , (DRISDOL ) 1.25 MG (50000 UNIT) CAPS capsule Take 1 capsule (50,000 Units total) by mouth every 7 (seven) days. (Patient not taking: Reported on 03/14/2024) 8 capsule 0   ibuprofen  (ADVIL ) 800 MG tablet Take 1 tablet (800 mg total) by mouth 3 (three) times daily. (Patient not taking: Reported on 03/14/2024) 21 tablet 0   No current facility-administered medications on file prior to visit.    Allergies  Allergen Reactions   Milk-Related Compounds Rash    Physical Exam:    Vitals:   03/14/24 0950  BP: 129/80  Pulse: 77  Weight: 207 lb (93.9 kg)  Height: 5' 6 (1.676 m)   Wt Readings from Last 3 Encounters:  03/14/24 207 lb (93.9 kg) (98%, Z= 2.05)*  02/12/24 205 lb 12.8 oz (93.4 kg) (98%, Z= 2.03)*  01/10/24 213 lb (96.6 kg) (98%, Z= 2.12)*   * Growth percentiles are based on CDC (Girls, 2-20 Years) data.     Blood pressure %iles are not available for patients who are 18 years or older. No LMP recorded. Patient has had an implant.  Physical Exam Constitutional:      General: She is not in acute distress.    Appearance: She is well-developed.  HENT:     Head:  Normocephalic and atraumatic.     Mouth/Throat:     Mouth: Mucous membranes are moist.  Eyes:     General: No scleral icterus.    Pupils: Pupils are equal, round, and reactive to light.  Neck:     Thyroid: No thyromegaly.  Cardiovascular:     Rate and Rhythm: Normal rate and regular rhythm.     Heart sounds: Normal heart sounds. No murmur heard. Pulmonary:     Effort: Pulmonary effort is normal.     Breath sounds: Normal breath sounds.  Abdominal:     Palpations: Abdomen is soft.  Musculoskeletal:        General: Normal range of motion.     Cervical back: Normal range of motion and neck supple.  Lymphadenopathy:     Cervical: No cervical adenopathy.  Skin:    General: Skin is warm and dry.     Capillary  Refill: Capillary refill takes less than 2 seconds.     Findings: No rash.     Comments: Implant palpable correct position LUE   Neurological:     Mental Status: She is alert and oriented to person, place, and time.     Cranial Nerves: No cranial nerve deficit.  Psychiatric:        Behavior: Behavior normal.        Thought Content: Thought content normal.        Judgment: Judgment normal.      Assessment/Plan: 1. Iron deficiency anemia, unspecified iron deficiency anemia type (Primary) 2. Breakthrough bleeding on Nexplanon  -hgb is down from last visit after most recent bleeding  -advised to start taking iron supplement once daily with breakfast  -take with OJ to help with absorption  -take OCP as needed for bleeding  -discussed that not ideal to take two hormonal methods to manage cycle; she will continue to monitor bleeding and report new bleeding  - POCT hemoglobin - repeat at next visit - norethindrone-ethinyl estradiol -FE (JUNEL FE 1/20) 1-20 MG-MCG tablet; Take 1 tablet by mouth daily. Take when bleeding.  Dispense: 28 tablet; Refill: 0 - ferrous sulfate 325 (65 FE) MG tablet; Take 1 tablet (325 mg total) by mouth daily with breakfast.  Dispense: 60 tablet; Refill:  0   3.  Adjustment disorder with anxious mood -doing well with improved sleep with hydroxyzine  25-50 mg nightly as needed  -safety confirmed  -repeat PHQSADS - hydrOXYzine  (ATARAX ) 25 MG tablet; Take 1-2 tablets (25-50 mg total) by mouth at bedtime as needed.  Dispense: 30 tablet; Refill: 0

## 2024-03-14 NOTE — BH Specialist Note (Unsigned)
 Integrated Behavioral Health Follow Up In-Person Visit  MRN: [id number] Name: Suzanne Acevedo  Number of Integrated Behavioral Health Clinician visits: Additional Visit  Session Start time: 1337   Session End time: 1437  Total time in minutes: 60    Types of Service: Individual psychotherapy  Interpretor:No. Interpretor Name and Language: n/a  Subjective: Suzanne Acevedo is a 18 y.o. female accompanied by self Suzanne Acevedo was referred by Dr. Gabriella for concerns with mood. Suzanne Acevedo reports the following symptoms/concerns:  -goal setting -maintaining progress since re-engaging with ex boyfriend -starting medication for sleep  Duration of problem: years; Severity of problem: moderate  Objective: Mood: Euthymic and Affect: Appropriate Risk of harm to self or others: No plan to harm self or others   Patient and/or Family's Strengths/Protective Factors: Social connections, Sense of purpose, and Physical Health (exercise, healthy diet, medication compliance, etc.)  Goals Addressed: Suzanne Acevedo will:  Suzanne Acevedo will identify and implement at least 3 coping strategies (e.g. grounding techniques, deep breathing, journaling) to manage symptoms of anxiety, reporting a reductions in anxiety from daily levels of 7/10 to 5/10 or less on self rating scale within 12 weeks.  Suzanne Acevedo will practice and demonstrate at least 2 emotional regulation strategies (e.g., mindfulness, stop and think pause, self-soothing) during session and in real life situations, resulting in a decrease in impulsive reactions and improved communication in at least 2 identified interpersonal relationships within 16 weeks. Progress towards Goals: Ongoing  Interventions: Interventions utilized:  CBT Cognitive Behavioral Therapy, Medication Monitoring, Supportive Counseling, and ACT (Acceptance and Commitment Therapy) Standardized Assessments completed: Not Needed   Patient and/or Family Response: Suzanne Acevedo was engaged and attentive  during the visit. She reported improvement in mood and motivation over the past few weeks. Suzanne Acevedo has been incorporating self care and healthy habits into her daily routine. She stated that her family has also noticed improvement in her mood which has had a positive impact on their relationship.   Suzanne Acevedo and her ex-partner have been communicating lately and while she reports some feelings of anxiousness about their future, she has been managing her symptoms well. 'I don't want to let anything get in way of my goals. She acknowledged that she still seeks reassurance that they are working towards being together, but she does not become dysregulated by his responses.   Suzanne Acevedo identified personal values including faith, self-discipline and family. She collaborated with [facility] to identify why each value was important to her and ways she honors her values. She is open to reflecting on ways she has not honored these values and changes she can make to uphold them.   Suzanne Acevedo has started taking ATARAX  (25mg ) to help with sleep. She reports effectiveness with sleep hygiene.    Patient Centered Plan: Patient is on the following Treatment Plan(s): Interpersonal Stress  Clinical Assessment/Diagnosis  Generalized anxiety disorder    Assessment: Suzanne Acevedo currently experiencing improvement in anxiety symptoms as evidence by self report. Re-engagement with ex partner does not appear to be negatively impacting mood of symptoms as this time. Continuing to prioritize self-care and healthy habits will be beneficial in maintaining progress. Further assessment needed for ADHD concerns.   Suzanne Acevedo may benefit from continuing therapy to learn ways to activate positive behaviors and coping strategies to manage anxiety and impulsiveness. Further evaluation of symptoms on trend for ADHD diagnosis.   Plan: Follow up with behavioral health clinician on : 03/25/2024 Behavioral recommendations:  Reflect on ways you have no honored  you personal values and small changes you can make  to uphold them. Journal about how these values shape you experience and daily life.  Referral(s): Integrated [facility] (In Clinic)  [place], [place]

## 2024-03-25 ENCOUNTER — Ambulatory Visit

## 2024-03-25 DIAGNOSIS — F411 Generalized anxiety disorder: Secondary | ICD-10-CM | POA: Diagnosis not present

## 2024-03-25 NOTE — BH Specialist Note (Signed)
 Integrated Behavioral Health via Telemedicine Visit  03/25/2024 Suzanne Acevedo [id number]  Number of Integrated Behavioral Health Clinician visits: Additional Visit  Session Start time: 1122   Session End time: 1203  Total time in minutes: 41    Referring Provider: Dr. Gabriella Patient/Family location: home  Suzanne Acevedo Provider location: [facility] office  All persons participating in visit: Suzanne Acevedo and Suzanne Acevedo Types of Service: Individual psychotherapy and Video visit  I connected with Suzanne Acevedo via Telephone or Video Enabled Telemedicine Application  (Video is Caregility application) and verified that I am speaking with the correct person using two identifiers. Discussed confidentiality: Yes   I discussed the limitations of telemedicine and the availability of in person appointments.  Discussed there is a possibility of technology failure and discussed alternative modes of communication if that failure occurs.  I discussed that engaging in this telemedicine visit, they consent to the provision of behavioral healthcare and the services will be billed under their insurance.  Patient and/or legal guardian expressed understanding and consented to Telemedicine visit: Yes   Presenting Concerns: Patient and/or family reports the following symptoms/concerns:  -emotional dysregulation following incident with ex boyfriend Duration of problem: years; Severity of problem: mild  Patient and/or Family's Strengths/Protective Factors: Social connections, Concrete supports in place (healthy food, safe environments, etc.), Sense of purpose, and Physical Health (exercise, healthy diet, medication compliance, etc.)  Goals Addressed:  Suzanne Acevedo will:  Suzanne Acevedo will identify and implement at least 3 coping strategies (e.g. grounding techniques, deep breathing, journaling) to manage symptoms of anxiety, reporting a reductions in anxiety from daily levels of 7/10 to 5/10 or less on self rating scale within 12 weeks.   Suzanne Acevedo will practice and demonstrate at least 2 emotional regulation strategies (e.g., mindfulness, stop and think pause, self-soothing) during session and in real life situations, resulting in a decrease in impulsive reactions and improved communication in at least 2 identified interpersonal relationships within 16 weeks. Progress towards Goals: Ongoing    Interventions: Interventions utilized:  Solution-Focused Strategies, CBT Cognitive Behavioral Therapy, Supportive Counseling, and ACT (Acceptance and Commitment Therapy) Standardized Assessments completed: Not Needed    Patient and/or Family Response: Suzanne Acevedo was engaged and attentive during the visit. She shared her response for the Values assignment. She collaborated with [facility] to identify small changes she could make to uphold her personal values.   Suzanne Acevedo informed Suzanne Acevedo that he ex boyfriend came to visit this past weekend. She felt awkward because she didn't know how to act towards him since they are not in a relationship. She disclosed that she experienced a setback during his visit due to him turning down a hangout with her to go to a party with friends. She processed that she had set an expectation of him that she did not share which caused her to respond irrationally. She identified areas of improvement and with guidance from Suzanne Acevedo demonstrated self compassion.  Clinical Assessment/Diagnosis  Generalized anxiety disorder    Assessment: Suzanne Acevedo currently experiencing continued improvement in anxiety symptoms as evidence by self report. Re-engagement with ex partner has caused slight regression in impulsiveness and emotional dysregulation. ADHD concerns  need to be further explored.  Suzanne Acevedo may benefit from continuing therapy to learn ways to activate positive behaviors and coping strategies to manage anxiety and impulsiveness. Further evaluation of symptoms on trend for ADHD diagnosis. .  Plan: Follow up with behavioral health  clinician on : 04/12/2024 Behavioral recommendations:  Continue using healthy coping strategies to maintain positive mood Use the PAUSE method before responding  to stressful situations. Explore what you are feeling and why.  Referral(s): Integrated [facility] (In Clinic)  I discussed the assessment and treatment plan with the patient and/or parent/guardian. They were provided an opportunity to ask questions and all were answered. They agreed with the plan and demonstrated an understanding of the instructions.   They were advised to call back or seek an in-person evaluation if the symptoms worsen or if the condition fails to improve as anticipated.  Suzanne Acevedo PARAS [place], LCSW

## 2024-04-15 ENCOUNTER — Ambulatory Visit

## 2024-04-15 ENCOUNTER — Encounter: Payer: Self-pay | Admitting: Family

## 2024-04-15 ENCOUNTER — Telehealth: Admitting: Family

## 2024-04-15 DIAGNOSIS — Z975 Presence of (intrauterine) contraceptive device: Secondary | ICD-10-CM | POA: Diagnosis not present

## 2024-04-15 DIAGNOSIS — N921 Excessive and frequent menstruation with irregular cycle: Secondary | ICD-10-CM

## 2024-04-15 NOTE — Progress Notes (Signed)
 THIS RECORD MAY CONTAIN CONFIDENTIAL INFORMATION THAT SHOULD NOT BE RELEASED WITHOUT REVIEW OF THE SERVICE PROVIDER.  Virtual Follow-Up Visit via Video Note  I connected with Suzanne Acevedo   on 04/15/2024 at  1:30 PM EST by a video enabled telemedicine application and verified that I am speaking with the correct person using two identifiers.   Patient/parent location: home Provider location: [facility] office   I discussed the limitations of evaluation and management by telemedicine and the availability of in person appointments.  I discussed that the purpose of this telehealth visit is to provide medical care while limiting exposure to the novel coronavirus.  The patient expressed understanding and agreed to proceed.   Suzanne Acevedo is a 19 y.o. female referred by [name], MD here today for follow-up of breakthrough bleeding on Nexplanon .   History was provided by the patient.  Supervising Physician: Dr. Kreg Helena   Plan from Last Visit:   1. Iron deficiency anemia, unspecified iron deficiency anemia type (Primary) 2. Breakthrough bleeding on Nexplanon  -hgb is down from last visit after most recent bleeding  -advised to start taking iron supplement once daily with breakfast  -take with OJ to help with absorption  -take OCP as needed for bleeding  -discussed that not ideal to take two hormonal methods to manage cycle; she will continue to monitor bleeding and report new bleeding  - POCT hemoglobin - repeat at next visit - norethindrone-ethinyl estradiol -FE (JUNEL  FE 1/20) 1-20 MG-MCG tablet; Take 1 tablet by mouth daily. Take when bleeding.  Dispense: 28 tablet; Refill: 0 - ferrous sulfate  325 (65 FE) MG tablet; Take 1 tablet (325 mg total) by mouth daily with breakfast.  Dispense: 60 tablet; Refill: 0     3.  Adjustment disorder with anxious mood -doing well with improved sleep with hydroxyzine  25-50 mg nightly as needed  -safety confirmed  -repeat PHQSADS - hydrOXYzine   (ATARAX ) 25 MG tablet; Take 1-2 tablets (25-50 mg total) by mouth at bedtime as needed.  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 (Patient not taking: Reported on 03/14/2024) 8 capsule 0   Etonogestrel  (NEXPLANON  [place]) Inject into the skin.     ferrous sulfate  325 (65 FE) MG tablet Take 1 tablet (325 mg total) by mouth daily with breakfast. 60 tablet 0   fexofenadine  (ALLEGRA  ALLERGY ) 180 MG tablet Take 1 tablet (180 mg total) by mouth daily. 30 tablet 6   fluticasone  (FLONASE ) 50 MCG/ACT nasal spray Place 1 spray into both nostrils daily. 16 g 5   hydrOXYzine  (ATARAX ) 25 MG tablet Take 1-2 tablets (25-50 mg total) by mouth at bedtime as needed. 30 tablet 0   ibuprofen  (ADVIL ) 800 MG tablet  Take 1 tablet (800 mg total) by mouth 3 (three) times daily. (Patient not taking: Reported on 03/14/2024) 21 tablet 0   norethindrone-ethinyl estradiol -FE (JUNEL  FE 1/20) 1-20 MG-MCG tablet Take 1 tablet by mouth daily. Take when bleeding. 28 tablet 0   No facility-administered medications prior to visit.     Patient Active Problem List   Diagnosis Date Noted   Shoulder pain 03/04/2021   Oral allergy   syndrome, initial encounter 07/11/2017   Overweight, pediatric, BMI 85.0-94.9 percentile for age 56/14/2017   BMI (body mass index), pediatric, 85% to less than 95% for age 70/13/2016   Other acne 08/14/2014   Allergic rhinitis 08/12/2014  The following portions of the patient's history were reviewed and updated as appropriate: allergies, current medications, past family history, past medical history, past social history, past surgical history, and problem list.  Visual Observations/Objective:   General Appearance: Well nourished well developed, in no apparent distress.  Eyes: conjunctiva no swelling or erythema ENT/Mouth: No hoarseness, No cough for duration of visit.  Neck: Supple  Respiratory: Respiratory effort normal, normal rate, no retractions or distress.   Cardio: Appears well-perfused, noncyanotic Musculoskeletal: no obvious deformity Skin: visible skin without rashes, ecchymosis, erythema Neuro: Awake and oriented X 3,  Psych:  normal affect, Insight and Judgment appropriate.    Assessment/Plan: 1. Breakthrough bleeding on Nexplanon  (Primary) -discussed option of keeping method or changing; for now she will keep implant; reminded her of insertion date (May 2023); she is not having to use the OCPs for breakthrough bleeding; not using iron supplementation regularly; no sign of worsening anemia; return precautions reviewed, including bleeding >10 days, new or worsening symptoms.    I discussed the assessment and treatment plan with the patient and/or parent/guardian.  They were provided an opportunity to ask questions and all were answered.  They agreed with the plan and demonstrated an understanding of the instructions. They were advised to call back or seek an in-person evaluation in the emergency room if the symptoms worsen or if the condition fails to improve as anticipated.   Follow-up:   return as needed   Bari CHRISTELLA Molt, NP    CC: [name], MD, [name], MD       [1]  Allergies Allergen Reactions   Milk-Related Compounds Rash

## 2024-04-15 NOTE — BH Specialist Note (Unsigned)
 Integrated Behavioral Health via Telemedicine Visit  04/17/2024 Tanayah Iglesias [id number]  Number of Integrated Behavioral Health Clinician visits: Additional Visit  Session Start time: 1032   Session End time: 1111  Total time in minutes: 39    Referring Provider: Dr. Gabriella Patient/Family location: home [facility] Provider location: [facility]  All persons participating in visit: [facility] and [facility] Types of Service: Individual psychotherapy and Video visit  I connected with Kirin Kavanagh via  Telephone or Video Enabled Telemedicine Application  (Video is Caregility application) and verified that I am speaking with the correct person using two identifiers. Discussed confidentiality: Yes   I discussed the limitations of telemedicine and the availability of in person appointments.  Discussed there is a possibility of technology failure and discussed alternative modes of communication if that failure occurs.  I discussed that engaging in this telemedicine visit, they consent to the provision of behavioral healthcare and the services will be billed under their insurance.  Patient and/or legal guardian expressed understanding and consented to Telemedicine visit: Yes   Presenting Concerns: Kelci and/or family reports the following symptoms/concerns:  - managing emotions  Duration of problem: years; Severity of problem: mild  Patient and/or Family's Strengths/Protective Factors: Social connections, Concrete supports in place (healthy food, safe environments, etc.), Sense of purpose, and Physical Health (exercise, healthy diet, medication compliance, etc.)  Goals Addressed: Macil will:  Lorrayne will identify and implement at least 3 coping strategies (e.g. grounding techniques, deep breathing, journaling) to manage symptoms of anxiety, reporting a reductions in anxiety from daily levels of 7/10 to 5/10 or less on self rating scale within 12 weeks.  Latoya will practice and demonstrate at  least 2 emotional regulation strategies (e.g., mindfulness, stop and think pause, self-soothing) during session and in real life situations, resulting in a decrease in impulsive reactions and improved communication in at least 2 identified interpersonal relationships within 16 weeks.  Progress towards Goals: Ongoing    Interventions: Interventions utilized:  Motivational Interviewing, Solution-Focused Strategies, and Supportive Counseling Standardized Assessments completed: Not Needed    Patient and/or Family Response: Larina was engaged and attentive during the visit. She shared that she recently started working at [employer] (2 weeks). She shared that work has been ok, not bad and not great. She stated that she has liked meeting new people and everybody is funny, but doesn't like that heavy lifting.    Melisse initially shared that emotionally things have been better than before. She continues to have contact with her ex and reported that while they are not in a relationship, they still act like a couple. She noticed that she still becomes emotional and complains when he does certain things(not communicating or rushing communication with her). She stated that they have started setting boundaries. After sharing examples of the boundaries they have set, she acknowledged that aligned with issues of control they've had in the past. Nyriah collaborated with [facility] to identify conflicts in the boundaries they discussed since they are not in a romantic relationship. She expressed frustration with their current situation and the way it has been impacting her emotionally. She was receptive to the recommendation of having a clear conversation with her ex in a calm environment to allow them both space to share their thoughts.   Chayla has not been implement coping and self care practice in her schedule recently and expressed understanding of the importance of her continuing to use them. Discussed adjustments to  her schedule due to her new job and practicing self-compassion  when she isn't able to dedicate as much time to these practices.   She has no longer been taking her ATARAX  (25mg ) because of her new work schedule.   Clinical Assessment/Diagnosis  Generalized anxiety disorder    Assessment: Elesa currently experiencing continued improvement in anxiety symptoms as evidence by self report. Relationship with ex boyfriend continues to be a stressor despite efforts to establish boundaries. Maladaptive behaviors such as emotional dysregulation and impulsiveness continue to shoe regression as evidence by self-report.     Blyss may benefit from continuing therapy to learn way to activate positive behaviors and coping strategies to manage anxiety and impulsiveness. Re-implementing regular self-care practices to improve overall mood and to maintain emotional independence separate from her relationship with her ex.    Plan: Follow up with behavioral health clinician on : 05/13/2024 Behavioral recommendations:  Re-implement healthy coping strategies and self care practices into you schedule. Find a non-confrontational way to discuss boundaries with ex. Use breathing and stop think strategy to manage emotions when discussing.  Referral(s): Integrated [employer] (In Clinic)  I discussed the assessment and treatment plan with the patient and/or parent/guardian. They were provided an opportunity to ask questions and all were answered. They agreed with the plan and demonstrated an understanding of the instructions.   They were advised to call back or seek an in-person evaluation if the symptoms worsen or if the condition fails to improve as anticipated.  Silvano PARAS [place], LCSW

## 2024-04-19 ENCOUNTER — Ambulatory Visit (HOSPITAL_COMMUNITY)
Admission: EM | Admit: 2024-04-19 | Discharge: 2024-04-19 | Disposition: A | Discharge status: Home / Self Care | Attending: Physician Assistant | Admitting: Physician Assistant

## 2024-04-19 ENCOUNTER — Ambulatory Visit (HOSPITAL_COMMUNITY)

## 2024-04-19 DIAGNOSIS — M25512 Pain in left shoulder: Secondary | ICD-10-CM

## 2024-04-19 MED ORDER — LIDOCAINE 5 % EX PTCH: 1.0000 | MEDICATED_PATCH | CUTANEOUS | 0 refills | Status: AC

## 2024-04-19 MED ORDER — METHOCARBAMOL 500 MG PO TABS: 500.0000 mg | ORAL_TABLET | Freq: Two times a day (BID) | ORAL | 0 refills | Status: AC

## 2024-04-19 MED ORDER — NAPROXEN 375 MG PO TABS: 375.0000 mg | ORAL_TABLET | Freq: Two times a day (BID) | ORAL | 0 refills | Status: AC

## 2024-04-19 NOTE — ED Provider Notes (Signed)
 " [facility]    CSN: [id number] Arrival date & time: 04/19/24  1639      History   Chief Complaint Chief Complaint  Patient presents with   Shoulder Pain    HPI Suzanne Acevedo is a 18 y.o. female.   Patient presents today with a several day history of recurrent left shoulder pain.  She denies any injury or increased activity prior to symptom onset but does have a physically demanding job working at [employer] where she has to lift heavy boxes.  She denies any previous injury or surgery involving her shoulder.  She has tried Tylenol  and ibuprofen  without improvement.  She reports that pain is rated 8 on a 0-10 pain scale, described as aching with periodic sharp popping pains with certain movements, no alleviating factors identified.  She denies any numbness or paresthesias in her hand.  She is right-handed.  She denies any fever, rash, nausea, vomiting.  She is confident that she is not pregnant.  She is having difficulty with the activities as a result of the symptoms.    Past Medical History:  Diagnosis Date   Cerumen impaction 03/2018   bilateral    Patient Active Problem List   Diagnosis Date Noted   Shoulder pain 03/04/2021   Oral allergy  syndrome, initial encounter 07/11/2017   Overweight, pediatric, BMI 85.0-94.9 percentile for age 65/14/2017   BMI (body mass index), pediatric, 85% to less than 95% for age 57/13/2016   Other acne 08/14/2014   Allergic rhinitis 08/12/2014    Past Surgical History:  Procedure Laterality Date   FRENULECTOMY, LINGUAL     MYRINGOTOMY WITH TUBE PLACEMENT Bilateral 11/12/2013   Procedure: BILATERAL MYRINGOTOMY WITH TUBE PLACEMENT;  Surgeon: Ana LELON Moccasin, MD;  Location: [facility];  Service: ENT;  Laterality: Bilateral;    OB History   No obstetric history on file.      Home Medications    Prior to Admission medications  Medication Sig Start Date End Date Taking? Authorizing Provider  lidocaine  (LIDODERM ) 5 %  Place 1 patch onto the skin daily. Remove & Discard patch within 12 hours or as directed by MD 04/19/24  Yes Cleta Heatley K, PA-C  methocarbamol  (ROBAXIN ) 500 MG tablet Take 1 tablet (500 mg total) by mouth 2 (two) times daily. 04/19/24  Yes Adaira Centola K, PA-C  naproxen  (NAPROSYN ) 375 MG tablet Take 1 tablet (375 mg total) by mouth 2 (two) times daily. 04/19/24  Yes Ziza Hastings K, PA-C  Etonogestrel  (NEXPLANON  [place]) Inject into the skin.    [provider]  ferrous sulfate  325 (65 FE) MG tablet Take 1 tablet (325 mg total) by mouth daily with breakfast. 03/14/24 05/13/24  Joshua Bari HERO, NP  fexofenadine  (ALLEGRA  ALLERGY ) 180 MG tablet Take 1 tablet (180 mg total) by mouth daily. 01/10/24   [name], MD  fluticasone  (FLONASE ) 50 MCG/ACT nasal spray Place 1 spray into both nostrils daily. 01/10/24   [name], MD  hydrOXYzine  (ATARAX ) 25 MG tablet Take 1-2 tablets (25-50 mg total) by mouth at bedtime as needed. 03/14/24   Joshua Bari HERO, NP  norethindrone-ethinyl estradiol -FE (JUNEL  FE 1/20) 1-20 MG-MCG tablet Take 1 tablet by mouth daily. Take when bleeding. 03/14/24   Joshua Bari HERO, NP    Family History Family History  Problem Relation Age of Onset   Asthma Mother    Diabetes Mother    Hypertension Mother    Asthma Sister    Diabetes Sister  Asthma Brother    Seizures Brother        when younger   Diabetes Brother    Healthy Father     Social History Social History[1]   Allergies   Milk-related compounds   Review of Systems Review of Systems  Constitutional:  Positive for activity change. Negative for appetite change, fatigue and fever.  Gastrointestinal:  Negative for diarrhea and vomiting.  Musculoskeletal:  Positive for arthralgias. Negative for myalgias.  Skin:  Negative for rash.  Neurological:  Negative for dizziness, weakness, light-headedness, numbness and headaches.     Physical Exam Triage Vital Signs ED Triage Vitals  Encounter  Vitals Group     BP 04/19/24 1825 137/78     Girls Systolic BP Percentile --      Girls Diastolic BP Percentile --      Boys Systolic BP Percentile --      Boys Diastolic BP Percentile --      Pulse Rate 04/19/24 1825 66     Resp 04/19/24 1825 18     Temp 04/19/24 1825 98.3 F (36.8 C)     Temp Source 04/19/24 1825 Oral     SpO2 04/19/24 1825 98 %     Weight --      Height --      Head Circumference --      Peak Flow --      Pain Score 04/19/24 1822 9     Pain Loc --      Pain Education --      Exclude from Growth Chart --    No data found.  Updated Vital Signs BP 137/78 (BP Location: Left Arm)   Pulse 66   Temp 98.3 F (36.8 C) (Oral)   Resp 18   LMP 04/04/2024 (Exact Date)   SpO2 98%   Visual Acuity Right Eye Distance:   Left Eye Distance:   Bilateral Distance:    Right Eye Near:   Left Eye Near:    Bilateral Near:     Physical Exam Vitals reviewed.  Constitutional:      General: She is awake. She is not in acute distress.    Appearance: Normal appearance. She is well-developed. She is not ill-appearing.     Comments: Very pleasant female presented age in no acute distress sitting comfortable in exam room  HENT:     Head: Normocephalic and atraumatic.  Cardiovascular:     Rate and Rhythm: Normal rate and regular rhythm.     Heart sounds: Normal heart sounds, S1 normal and S2 normal. No murmur heard. Pulmonary:     Effort: Pulmonary effort is normal.     Breath sounds: Normal breath sounds. No wheezing, rhonchi or rales.     Comments: Clear to auscultation bilaterally Abdominal:     Palpations: Abdomen is soft.     Tenderness: There is no abdominal tenderness.  Musculoskeletal:     Left shoulder: Tenderness and bony tenderness present. No swelling. Decreased range of motion. Normal strength.     Cervical back: No crepitus. No pain with movement.     Thoracic back: No tenderness or bony tenderness.     Lumbar back: No tenderness or bony tenderness.      Comments: Left shoulder: Tender to palpation over medial scapula with popping sensation whenever patient circumduct's or flex at the shoulder.  Hands are neurovascularly intact.  Normal active range of motion despite discomfort.  Negative drop arm and empty can.  Negative Apley scratch.  Psychiatric:        Behavior: Behavior is cooperative.      UC Treatments / Results  Labs (all labs ordered are listed, but only abnormal results are displayed) Labs Reviewed - No data to display  EKG   Radiology DG Shoulder Left Result Date: 04/19/2024 EXAM: 1 VIEW(S) XRAY OF THE LEFT SHOULDER 04/19/2024 07:01:56 PM COMPARISON: None available. CLINICAL HISTORY: pain and popping with overhead extension FINDINGS: BONES AND JOINTS: Glenohumeral joint is normally aligned. No acute fracture. No malalignment. The [facility] joint is unremarkable. SOFT TISSUES: No abnormal calcifications. Visualized lung is unremarkable. IMPRESSION: 1. No acute findings. Electronically signed by: Greig Pique MD 04/19/2024 07:21 PM EST RP Workstation: [id number]    Procedures Procedures (including critical care time)  Medications Ordered in UC Medications - No data to display  Initial Impression / Assessment and Plan / UC Course  I have reviewed the triage vital signs and the nursing notes.  Pertinent labs & imaging results that were available during my care of the patient were reviewed by me and considered in my medical decision making (see chart for details).     Patient is well-appearing, afebrile, nontoxic, nontachycardic.  X-ray was obtained given the clicking sensation with overhead flexion and ongoing pain which showed no acute osseous abnormality.  Recommended that she use Naprosyn  twice daily for pain relief.  We discussed that she is not to take NSAIDs due to risk of GI bleeding but can use Tylenol  for breakthrough pain.  She was also given methocarbamol  up to twice a day.  We discussed this can be sedating and she is  not to drive or drink alcohol taking it.  She was also given lidocaine  patch for additional symptom relief.  Recommended follow-up with orthopedics and was given the contact information for local provider with instruction to call to schedule an appointment.  We discussed that if anything worsens or changes and she has increasing pain, numbness or paresthesias in her hand, weakness she needs to be seen emergently.  Return precautions given.  Excuse note provided.  Final Clinical Impressions(s) / UC Diagnoses   Final diagnoses:  Acute pain of left shoulder     Discharge Instructions      Your x-ray did not show any evidence of a broken or out of place bone.  I would like you to follow-up with orthopedics for further evaluation and management.  Call them to schedule an appointment.  Take Naprosyn  twice a day to help with your pain.  Do not take NSAIDs with this medication including aspirin, ibuprofen /Advil , naproxen /Aleve .  You can use acetaminophen /Tylenol  for breakthrough pain.  Take methocarbamol  up to twice a day.  This will make you sleepy so do not drive or drink alcohol with taking it.  Apply lidocaine  patch during the day and then remove this at night.  I also commend heat and gentle stretch for symptom relief.  If anything worsens you need to be seen immediately.     ED Prescriptions     Medication Sig Dispense Auth. Provider   naproxen  (NAPROSYN ) 375 MG tablet Take 1 tablet (375 mg total) by mouth 2 (two) times daily. 20 tablet Harman Ferrin K, PA-C   methocarbamol  (ROBAXIN ) 500 MG tablet Take 1 tablet (500 mg total) by mouth 2 (two) times daily. 20 tablet Sidda Humm K, PA-C   lidocaine  (LIDODERM ) 5 % Place 1 patch onto the skin daily. Remove & Discard patch within 12 hours or as directed by MD 10 patch Kataleia Quaranta,  Quashawn Jewkes K, PA-C      PDMP not reviewed this encounter.     [1]  Social History Tobacco Use   Smoking status: Never    Passive exposure: Yes   Smokeless tobacco: Never    Tobacco comments:    inside smokers at home  Vaping Use   Vaping status: Never Used  Substance Use Topics   Alcohol use: Never    Comment: sometimes   Drug use: Never     Sherrell Rocky POUR, PA-C 04/19/24 1955  "

## 2024-04-19 NOTE — ED Triage Notes (Signed)
 Pt has c/o left shoulder pain that started 2 days ago, but has been getting progressively worse. Has been tylenol  and ibuprofen , but with little relief. Last dose of tylenol  was at 3am

## 2024-04-19 NOTE — Discharge Instructions (Signed)
 Your x-ray did not show any evidence of a broken or out of place bone.  I would like you to follow-up with orthopedics for further evaluation and management.  Call them to schedule an appointment.  Take Naprosyn  twice a day to help with your pain.  Do not take NSAIDs with this medication including aspirin, ibuprofen /Advil , naproxen /Aleve .  You can use acetaminophen /Tylenol  for breakthrough pain.  Take methocarbamol  up to twice a day.  This will make you sleepy so do not drive or drink alcohol with taking it.  Apply lidocaine  patch during the day and then remove this at night.  I also commend heat and gentle stretch for symptom relief.  If anything worsens you need to be seen immediately.

## 2024-05-13 ENCOUNTER — Ambulatory Visit: Payer: Self-pay

## 2024-05-13 ENCOUNTER — Ambulatory Visit: Admitting: Pediatrics

## 2024-05-13 ENCOUNTER — Encounter: Payer: Self-pay | Admitting: Pediatrics

## 2024-05-13 ENCOUNTER — Other Ambulatory Visit (HOSPITAL_COMMUNITY): Admission: RE | Admit: 2024-05-13 | Discharge: 2024-05-13 | Disposition: A | Source: Ambulatory Visit

## 2024-05-13 VITALS — BP 118/68 | Ht 65.2 in | Wt 205.2 lb

## 2024-05-13 DIAGNOSIS — N921 Excessive and frequent menstruation with irregular cycle: Secondary | ICD-10-CM

## 2024-05-13 DIAGNOSIS — Z113 Encounter for screening for infections with a predominantly sexual mode of transmission: Secondary | ICD-10-CM

## 2024-05-13 DIAGNOSIS — E669 Obesity, unspecified: Secondary | ICD-10-CM | POA: Diagnosis not present

## 2024-05-13 DIAGNOSIS — Z0001 Encounter for general adult medical examination with abnormal findings: Secondary | ICD-10-CM

## 2024-05-13 DIAGNOSIS — Z1331 Encounter for screening for depression: Secondary | ICD-10-CM | POA: Diagnosis not present

## 2024-05-13 DIAGNOSIS — G8929 Other chronic pain: Secondary | ICD-10-CM | POA: Diagnosis not present

## 2024-05-13 DIAGNOSIS — Z23 Encounter for immunization: Secondary | ICD-10-CM

## 2024-05-13 DIAGNOSIS — Z1339 Encounter for screening examination for other mental health and behavioral disorders: Secondary | ICD-10-CM | POA: Diagnosis not present

## 2024-05-13 DIAGNOSIS — M25512 Pain in left shoulder: Secondary | ICD-10-CM

## 2024-05-13 DIAGNOSIS — Z68.41 Body mass index (BMI) pediatric, greater than or equal to 95th percentile for age: Secondary | ICD-10-CM

## 2024-05-13 DIAGNOSIS — Z114 Encounter for screening for human immunodeficiency virus [HIV]: Secondary | ICD-10-CM

## 2024-05-13 DIAGNOSIS — D509 Iron deficiency anemia, unspecified: Secondary | ICD-10-CM

## 2024-05-13 DIAGNOSIS — Z0000 Encounter for general adult medical examination without abnormal findings: Secondary | ICD-10-CM

## 2024-05-13 DIAGNOSIS — F411 Generalized anxiety disorder: Secondary | ICD-10-CM

## 2024-05-13 LAB — POCT HEMOGLOBIN: Hemoglobin: 9.6 g/dL — AB (ref 11–14.6)

## 2024-05-13 LAB — POCT RAPID HIV: Rapid HIV, POC: NEGATIVE

## 2024-05-13 NOTE — BH Specialist Note (Unsigned)
 "   Integrated Behavioral Health via Telemedicine Visit  05/14/2024 Yazleen Molock [id number]  Number of Integrated Behavioral Health Clinician visits: Additional Visit  Session Start time: 1125   Session End time: 1217  Total time in minutes: 52    Referring Provider: Dr. Gabriella  Patient/Family location: home [facility] Provider location: [facility] Office  All persons participating in visit: [facility]. [facility] Fleshia was present during visit. Patient was agreeable to her shadowing.  Types of Service: Individual psychotherapy and Video visit  I connected with Ticara Mcghee via Telephone or Video Enabled Telemedicine Application  (Video is Caregility application) and verified that I am speaking with the correct person using two identifiers. Discussed confidentiality: Yes   I discussed the limitations of telemedicine and the availability of in person appointments.  Discussed there is a possibility of technology failure and discussed alternative modes of communication if that failure occurs.  I discussed that engaging in this telemedicine visit, they consent to the provision of behavioral healthcare and the services will be billed under their insurance.  Patient and/or legal guardian expressed understanding and consented to Telemedicine visit: Yes   Presenting Concerns: Janith and/or family reports the following symptoms/concerns:  -emotional response to situations that bother her - consistency with self care practices  Duration of problem: months to years; Severity of problem: mild  Patient and/or Family's Strengths/Protective Factors: Social connections, Concrete supports in place (healthy food, safe environments, etc.), Sense of purpose, and Physical Health (exercise, healthy diet, medication compliance, etc.)  Goals Addressed: Gao will:  Jawanda will identify and implement at least 3 coping strategies (e.g. grounding techniques, deep breathing, journaling) to manage symptoms of anxiety, reporting  a reductions in anxiety from daily levels of 7/10 to 5/10 or less on self rating scale within 12 weeks.  Lella will practice and demonstrate at least 2 emotional regulation strategies (e.g., mindfulness, stop and think pause, self-soothing) during session and in real life situations, resulting in a decrease in impulsive reactions and improved communication in at least 2 identified interpersonal relationships within 16 weeks.    Progress towards Goals: Ongoing    Interventions: Interventions utilized:  Motivational Interviewing, Solution-Focused Strategies, CBT Cognitive Behavioral Therapy, and Supportive Counseling [facility] reviewed previous visit and discussed progress made. Provided safe space for Braidyn to explore thoughts and feelings. Supported in identifying connection between her expectations of the relationship and her emotional responses. Explored current strategies to manage stress and encouraged continued use of these strategies.  Standardized Assessments completed: Not Needed    Patient and/or Family Response: Sianne was engaged and attentive during the visit. She reported that she has been experiencing arm pain due to moving heavy boxes at work. She experienced similar pain when playing on the drum line in high school. She was recommended lidocain patches but cost is a barrier. She has started using Icy Hot patches instead. She was also recommended to schedule an appt with orthopedics.   Altovise shared that she has been doing ok emotionally and rated her overall mood an 8 out of 10. Catori was excited to share that she has stopped smoking marijuana noting she has swapped out smoking for eating ice cream. She also shared that she has been intentional with not focusing on the negative when things happen. Instead she has been focusing on problem solving and challenging negative thought patterns.     Ilea shared that she has detached myself from her ex and has stopped expecting  relationship things from him. She acknowledged occasionally struggles with  expectations and requested recommendations for how to reduce them. She was receptive to feedback about her desire to control outcomes as it relates to her ex meeting someone new and starting a new relationship. She also shared that she noticed her slacking on priorities when he is home and was open to explore her personal responsibility in this area.  She identified areas of growth and long term benefits of changes on her her overall well-being.   Clinical Assessment/Diagnosis  Generalized anxiety disorder    Assessment: Assia currently experiencing continued improvement in anxiety symptoms as evidence by self-report. Interpersonal stress has slightly improved as noted by self report, however it remains an area improvement. Cessation of marijuana use with implementation of more helpful coping strategies has been noted.    Adiel may benefit from continuing therapy to learn ways to activate positive behaviors and coping strategies to manage anxiety and impulsiveness. Intentional use of  regular self-care practices to improve maintain progress.  Plan: Follow up with behavioral health clinician on : 06/03/2024 Behavioral recommendations:   Referral(s): Integrated [employer] (In Clinic)  I discussed the assessment and treatment plan with the patient and/or parent/guardian. They were provided an opportunity to ask questions and all were answered. They agreed with the plan and demonstrated an understanding of the instructions.   They were advised to call back or seek an in-person evaluation if the symptoms worsen or if the condition fails to improve as anticipated.  Silvano PARAS [place], LCSW   "

## 2024-05-13 NOTE — Progress Notes (Unsigned)
 Adolescent Well Care Visit Suzanne Acevedo is a 19 y.o. female who is here for well care.    PCP:  [name], MD   History was provided by the {CHL AMB PERSONS; PED RELATIVES/OTHER W/PATIENT:[phone]}.  Confidentiality was discussed with the patient and, if applicable, with caregiver as well. Patient's personal or confidential phone number: ***   Current Issues: Current concerns include ***.   Nutrition: Nutrition/Eating Behaviors: *** Adequate calcium in diet?: *** Supplements/ Vitamins: ***  Exercise/ Media: Play any Sports?/ Exercise: *** Screen Time:  {CHL AMB SCREEN [phone]} Media Rules or Monitoring?: {YES NO:22349}  Sleep:  Sleep: ***  Social Screening: Lives with:  *** Parental relations:  {CHL AMB PED FAM RELATIONSHIPS:[phone]} Activities, Work, and Regulatory Affairs Officer?: *** Concerns regarding behavior with peers?  {yes***/no:17258} Stressors of note: {Responses; yes**/no:17258}  Education: School Name: ***  School Grade: *** School performance: {performance:16655} School Behavior: {misc; parental coping:16655}  Menstruation:   Patient's last menstrual period was 04/04/2024 (exact date). Menstrual History: ***   Confidential Social History: Tobacco?  {YES/NO/WILD RJMID:81418} Secondhand smoke exposure?  {YES/NO/WILD RJMID:81418} Drugs/ETOH?  {YES/NO/WILD RJMID:81418}  Sexually Active?  {YES [id number]   Pregnancy Prevention: ***  Safe at home, in school & in relationships?  {Yes or If no, why not?:20788} Safe to self?  {Yes or If no, why not?:20788}   Screenings: Patient has a dental home: {yes/no***:64::yes}  The patient completed the Rapid Assessment for Adolescent Preventive Services screening questionnaire and the following topics were identified as risk factors and discussed: {CHL AMB ASSESSMENT TOPICS:21012045}  In addition, the following topics were discussed as part of anticipatory guidance {CHL AMB ASSESSMENT  TOPICS:21012045}.  PHQ-9 completed and results indicated ***  Physical Exam:  Vitals:   05/13/24 1425  BP: 118/68  Weight: 205 lb 3.2 oz (93.1 kg)  Height: 5' 5.2 (1.656 m)   BP 118/68 (BP Location: Left Arm, Patient Position: Sitting, Cuff Size: Normal)   Ht 5' 5.2 (1.656 m)   Wt 205 lb 3.2 oz (93.1 kg)   LMP 04/04/2024 (Exact Date)   BMI 33.94 kg/m  Body mass index: body mass index is 33.94 kg/m. Blood pressure %iles are not available for patients who are 18 years or older.  Hearing Screening  Method: Audiometry   500Hz  1000Hz  2000Hz  4000Hz   Right ear 20 20 20 20   Left ear 20 20 20 20    Vision Screening   Right eye Left eye Both eyes  Without correction 20/20 20/20 20/20   With correction       General Appearance:   {PE GENERAL APPEARANCE:22457}  HENT: Normocephalic, no obvious abnormality, conjunctiva clear  Mouth:   Normal appearing teeth, no obvious discoloration, dental caries, or dental caps  Neck:   Supple; thyroid : no enlargement, symmetric, no tenderness/mass/nodules  Chest ***  Lungs:   Clear to auscultation bilaterally, normal work of breathing  Heart:   Regular rate and rhythm, S1 and S2 normal, no murmurs;   Abdomen:   Soft, non-tender, no mass, or organomegaly  GU {adol gu exam:315266}  Musculoskeletal:   Tone and strength strong and symmetrical, all extremities               Lymphatic:   No cervical adenopathy  Skin/Hair/Nails:   Skin warm, dry and intact, no rashes, no bruises or petechiae  Neurologic:   Strength, gait, and coordination normal and age-appropriate     Assessment and Plan:   ***  BMI {ACTION; IS/IS WNU:[id number]} appropriate for age  Hearing screening result:{normal/abnormal/not  examined:14677} Vision screening result: {normal/abnormal/not examined:14677}  Counseling provided for {CHL AMB PED VACCINE COUNSELING:210130100} vaccine components  Orders Placed This Encounter  Procedures   POCT Rapid HIV     Return in 1 year  (on 05/13/2025).SABRA Arthor LULLA Gabriella, MD

## 2024-05-14 DIAGNOSIS — D509 Iron deficiency anemia, unspecified: Secondary | ICD-10-CM | POA: Insufficient documentation

## 2024-05-14 DIAGNOSIS — N921 Excessive and frequent menstruation with irregular cycle: Secondary | ICD-10-CM | POA: Insufficient documentation

## 2024-05-14 LAB — URINE CYTOLOGY ANCILLARY ONLY
Chlamydia: NEGATIVE
Comment: NEGATIVE
Comment: NORMAL
Neisseria Gonorrhea: NEGATIVE

## 2024-05-14 MED ORDER — FERROUS SULFATE 325 (65 FE) MG PO TABS: 325.0000 mg | ORAL_TABLET | Freq: Every day | ORAL | 3 refills | Status: AC

## 2024-05-14 NOTE — Patient Instructions (Signed)
 Goals: Choose more whole grains, lean protein, low-fat dairy, and fruits/non-starchy vegetables. Aim for 60 min of moderate physical activity daily. Limit sugar-sweetened beverages and concentrated sweets. Limit screen time to less than 2 hours daily.  [id number] 5 servings of fruits/vegetables a day 3 meals a day, no meal skipping 2 hours of screen time or less 1 hour of vigorous physical activity Almost no sugar-sweetened beverages or foods

## 2024-06-03 ENCOUNTER — Encounter

## 2024-06-20 ENCOUNTER — Encounter

## 2024-08-12 ENCOUNTER — Ambulatory Visit: Admitting: Pediatrics
